# Patient Record
Sex: Male | Born: 1980 | Race: White | Marital: Single
Health system: Southern US, Academic
[De-identification: ages and names within clinical notes are randomized; demographics above are authoritative.]

---

## 2004-11-08 ENCOUNTER — Ambulatory Visit (INDEPENDENT_AMBULATORY_CARE_PROVIDER_SITE_OTHER): Payer: Self-pay

## 2005-01-10 ENCOUNTER — Ambulatory Visit (INDEPENDENT_AMBULATORY_CARE_PROVIDER_SITE_OTHER): Payer: Self-pay

## 2007-01-06 ENCOUNTER — Ambulatory Visit (INDEPENDENT_AMBULATORY_CARE_PROVIDER_SITE_OTHER): Payer: BC Managed Care – PPO

## 2007-02-03 ENCOUNTER — Encounter (INDEPENDENT_AMBULATORY_CARE_PROVIDER_SITE_OTHER): Payer: BC Managed Care – PPO

## 2007-02-27 ENCOUNTER — Encounter (INDEPENDENT_AMBULATORY_CARE_PROVIDER_SITE_OTHER): Payer: BC Managed Care – PPO | Admitting: Clinical

## 2007-03-13 ENCOUNTER — Encounter (INDEPENDENT_AMBULATORY_CARE_PROVIDER_SITE_OTHER): Payer: BC Managed Care – PPO | Admitting: Clinical

## 2007-03-17 ENCOUNTER — Encounter (HOSPITAL_COMMUNITY): Payer: BC Managed Care – PPO

## 2007-03-20 ENCOUNTER — Encounter (INDEPENDENT_AMBULATORY_CARE_PROVIDER_SITE_OTHER): Payer: BC Managed Care – PPO | Admitting: Clinical

## 2007-03-26 ENCOUNTER — Encounter (HOSPITAL_COMMUNITY): Payer: BC Managed Care – PPO | Admitting: Clinical

## 2007-04-07 ENCOUNTER — Encounter (INDEPENDENT_AMBULATORY_CARE_PROVIDER_SITE_OTHER): Payer: BC Managed Care – PPO | Admitting: Clinical

## 2007-04-21 ENCOUNTER — Encounter (HOSPITAL_COMMUNITY): Payer: BC Managed Care – PPO | Admitting: Clinical

## 2007-05-12 ENCOUNTER — Encounter (HOSPITAL_COMMUNITY): Payer: BC Managed Care – PPO

## 2007-08-02 ENCOUNTER — Encounter (HOSPITAL_PSYCHIATRIC): Payer: BC Managed Care – PPO | Admitting: Psychiatry

## 2007-08-02 ENCOUNTER — Emergency Department (HOSPITAL_COMMUNITY)
Admission: EM | Admit: 2007-08-02 | Discharge: 2007-08-02 | Disposition: A | Discharge status: Home / Self Care | Payer: BC Managed Care – PPO | Attending: Emergency Medicine | Admitting: Emergency Medicine

## 2007-08-02 ENCOUNTER — Inpatient Hospital Stay
Admission: RE | Admit: 2007-08-02 | Discharge: 2007-08-08 | DRG: 523 | Disposition: A | Discharge status: Home / Self Care | Payer: BC Managed Care – PPO | Source: Emergency Department | Attending: Psychiatry | Admitting: Psychiatry

## 2007-08-02 DIAGNOSIS — F17200 Nicotine dependence, unspecified, uncomplicated: Secondary | ICD-10-CM | POA: Diagnosis present

## 2007-08-02 DIAGNOSIS — F1020 Alcohol dependence, uncomplicated: Principal | ICD-10-CM | POA: Diagnosis present

## 2007-08-02 DIAGNOSIS — F1994 Other psychoactive substance use, unspecified with psychoactive substance-induced mood disorder: Secondary | ICD-10-CM | POA: Diagnosis present

## 2007-08-02 DIAGNOSIS — F3289 Other specified depressive episodes: Secondary | ICD-10-CM | POA: Diagnosis present

## 2007-08-02 LAB — DRUG SCREEN, HIGH OPIATE CUTOFF, WITH CONFIRMATION, URINE
AMPHETAMINE, URINE: NEGATIVE
BARBITURATE, URINE: NEGATIVE
BENZODIAZEPINE,URINE: NEGATIVE
COCAINE METAB. URINE: NEGATIVE
PHENCYCLIDINE, URINE: NEGATIVE

## 2007-08-02 LAB — CBC/DIFF
BASOPHILS: 1 % (ref 0–1)
BASOS ABS: 0.079 THOU/uL (ref 0.0–0.2)
EOS ABS: 0.495 THOU/uL — ABNORMAL HIGH (ref 0.1–0.3)
EOSINOPHIL: 5 % (ref 1–6)
HCT: 43.8 % (ref 39.8–50.2)
HGB: 15.2 g/dL (ref 13.1–17.3)
LYMPHOCYTES: 22 % (ref 20–45)
LYMPHS ABS: 2.21 THOU/uL (ref 1.0–4.8)
MCH: 32.9 pg (ref 27.4–33.0)
MCHC: 34.8 g/dL (ref 31.6–35.5)
MCV: 94.5 fL (ref 78–100)
MONOCYTES: 4 % (ref 4–13)
MONOS ABS: 0.357 THOU/uL (ref 0.1–0.9)
MPV: 6.1 FL — ABNORMAL LOW (ref 7.4–10.4)
PLATELET COUNT: 204 THO/UL (ref 140–450)
PMN ABS: 6.79 THOU/uL (ref 1.3–7.7)
PMN'S: 68 % (ref 40–75)
RBC: 4.63 MIL/uL (ref 4.46–5.70)
RDW: 12 % (ref 11.5–14.5)
WBC: 9.9 THOU/UL (ref 3.5–11.0)

## 2007-08-02 LAB — AMYLASE: AMYLASE: 70 U/L (ref <128)

## 2007-08-02 LAB — ELECTROLYTES
ANION GAP: 23 mmol/L — ABNORMAL HIGH (ref 5–16)
CARBON DIOXIDE: 20 mmol/L — ABNORMAL LOW (ref 23–33)
POTASSIUM: 3.4 mmol/L — ABNORMAL LOW (ref 3.5–5.1)
SODIUM: 143 mmol/L (ref 136–145)

## 2007-08-02 LAB — CREATININE
CREATININE: 0.79 mg/dL (ref 0.62–1.27)
ESTIMATED GLOMERULAR FILTRATION RATE: >59 mL/min/{1.73_m2} (ref >59)

## 2007-08-02 LAB — PT/INR
INR: 1 (ref 0.8–1.2)
PROTHROMBIN TIME: 10.5 s (ref 9.1–11.2)

## 2007-08-02 LAB — BILIRUBIN, TOTAL/CONJ
BILIRUBIN, TOTAL: 2.1 mg/dL — ABNORMAL HIGH (ref 0.3–1.3)
BILIRUBIN,CONJUGATED: 0.5 mg/dl — ABNORMAL HIGH (ref 0.0–0.3)

## 2007-08-02 LAB — ALT (SGPT): ALT (SGPT): 63 U/L — ABNORMAL HIGH (ref 10–46)

## 2007-08-02 LAB — LIPASE: LIPASE: 17 U/L (ref 6–51)

## 2007-08-02 LAB — GAMMA GT: GAMMA GT: 73 U/L — ABNORMAL HIGH (ref 7–50)

## 2007-08-02 LAB — BUN
BUN/CREAT RATIO: 19 (ref 6–22)
BUN: 15 mg/dL (ref 8–26)

## 2007-08-02 LAB — GLUCOSE, NON FASTING: GLUCOSE,NONFAST: 59 mg/dl — AB

## 2007-08-02 LAB — AST (SGOT): AST (SGOT): 137 U/L — ABNORMAL HIGH (ref 10–40)

## 2007-08-02 LAB — ETHANOL, SERUM: ETHANOL, SERUM: 182 mg/dL — AB

## 2007-08-02 LAB — ALK PHOS (ALKALINE PHOSPHATASE): ALKALINE PHOSPHATASE: 52 U/L (ref 38–126)

## 2007-08-02 NOTE — ED Provider Notes (Signed)
HPI Comments: Pt states that he has a drinking problem.  Currently drinking 1L vodka per noc, also 6-7 beers.  Drinking that much daily for 1 month.  Previously in rehab in April 2008.  After D/C, began drinking 1/2 pint vodka with 7-8 beers and then that escalated.  Pt states that he shakes when he doesn't drink.  No hx of EtOH withdrawal seizures.  Past 3 weeks has had difficulty tolerating PO.  Nausea after PO.  Can tolerate PO when he feels intoxicated.          Review of Systems   Constitutional: Negative.    HENT: Negative.    Cardiovascular: Negative for chest pain.   Respiratory: Is not experiencing shortness of breath.   Gastrointestinal: Positive for nausea. Negative for vomiting, abdominal pain, diarrhea and constipation.   Genitourinary: Negative for dysuria and frequency.   Musculoskeletal: Negative for neck pain and back pain.   Neurological: Negative.    All other systems reviewed and are negative.          Past History:    PMH: Depression    PSH: None    FamHx:    SocHx: +EtOH daily  3/4 ppd  No other drugs            Physical Exam   Nursing note and vitals reviewed.  Constitutional: He is oriented. He appears well-developed and well-nourished. He appears not diaphoretic. No distress.   HENT:   Head: Normocephalic and atraumatic.   Right Ear: External ear normal.   Left Ear: External ear normal.   Nose: Nose normal.   Mouth/Throat: Oropharynx is clear and moist.   Eyes: Conjunctivae and extraocular motions are normal. Pupils are equal, round, and reactive to light.   Neck: Normal range of motion. Neck supple.   Cardiovascular: Normal rate, regular rhythm, normal heart sounds and intact distal pulses.  Exam reveals no gallop and no friction rub.    No murmur heard.  Pulmonary/Chest: Effort normal and breath sounds normal. No respiratory distress. He has no wheezes. He has no rales.   Abdominal: Bowel sounds are normal. He exhibits no distension. Soft. No tenderness. He has no rebound and no guarding.     Musculoskeletal: Normal range of motion. He exhibits no edema and no tenderness.   Neurological: He is alert and oriented. He displays tremor. No cranial nerve deficit. He exhibits normal muscle tone. Coordination normal.   Skin: Skin is warm and dry. He is not diaphoretic.   Psychiatric: He has a normal mood and affect. His behavior is normal. Judgment and thought content normal.           ED Course:    Results:  Results for orders placed during the hospital encounter of 08/02/2007 (from the past 8 hours)    -CBC/DIFF     WBC (THOU/UL)                 9.9       Low: 3.5  High: 11.0     RBC (MIL/uL)                  4.63      Low: 4.46  High: 5.70     HGB (g/dL)                    46.9      Low: 13.1  High: 17.3     HCT (%)  43.8      Low: 39.8  High: 50.2     MCV (fL)                      94.5      Low: 78   High: 100     MCH (pg)                      32.9      Low: 27.4  High: 33.0     MCHC (g/dL)                   16.1      Low: 31.6  High: 35.5     RDW (%)                       12.0      Low: 11.5  High: 14.5     PLATELET COUNT (THO/UL)       204       Low: 140  High: 450     MPV (FL)                      6.1 (*)   Low: 7.4  High: 10.4     PMN'S (%)                     68        Low: 40   High: 75     PMN ABS (THOU/uL)             6.790     Low: 1.3  High: 7.7     LYMPHOCYTES (%)               22        Low: 20   High: 45     LYMPHS ABS (THOU/uL)          2.210     Low: 1.0  High: 4.8     MONOCYTES (%)                 4         Low: 4    High: 13     MONOS ABS (THOU/uL)           0.357     Low: 0.1  High: 0.9     EOSINOPHIL (%)                5         Low: 1    High: 6      EOS ABS (THOU/uL)             0.495 (*)  Low: 0.1  High: 0.3     BASOPHILS (%)                 1         Low: 0    High: 1      BASOS ABS (THOU/uL)           0.079     Low: 0.0  High: 0.2    -PT/INR     PROTHROMBIN TIME (Sec)        10.5      Low: 9.1  High: 11.2     INR  1.0       Low: 0.8   High: 1.2    -ALK PHOS (ALKALINE PHOSPHATASE)     ALKALINE PHOSPHATASE (U/L)    52        Low: 38   High: 126    -AMYLASE     AMYLASE (U/L)                 70                              -BUN     BUN (mg/dL)                   15        Low: 8    High: 26     BUN/CREAT RATIO               19        Low: 6    High: 22    -CREATININE     CREATININE (mg/dL)            4.01      Low: 0.62  High: 1.27     ESTIMATED GLOMERULAR FILTRATION RA* (ml/min/1.67m2)  >59                             -BILIRUBIN, TOTAL/CONJ     BILIRUBIN, TOTAL (mg/dL)      2.1 (*)   Low: 0.3  High: 1.3     BILIRUBIN,CONJUGATED (mg/dl)  0.5 (*)   Low: 0.0  High: 0.3    -ELECTROLYTES     SODIUM (mmol/L)               143       Low: 136  High: 145     POTASSIUM (mmol/L)            3.4 (*)   Low: 3.5  High: 5.1     CHLORIDE (mmol/L)             100       Low: 96   High: 111     CARBON DIOXIDE (mmol/L)       20 (*)    Low: 23   High: 33     ANION GAP (mmol/L)            23 (*)    Low: 5    High: 16    -GAMMA GT     GAMMA GT (U/L)                73 (*)    Low: 7    High: 50    -GLUCOSE, NON FASTING     GLUCOSE,NONFAST (mg/dl)       59 (*)                          -LIPASE     LIPASE (U/L)                  17        Low: 6    High: 51    -AST (SGOT)     AST (SGOT) (U/L)              137 (*)   Low: 10   High: 40    -ALT (SGPT)     ALT (  SGPT) (U/L)              63 (*)    Low: 10   High: 46    -ETHANOL, SERUM     ETHANOL, SERUM (mg/dL)        841 (*)                           ED Course:  Labs mildly elevated but within normal limits given patients abuse of EtOH.  Some withdrawal symptoms, so ativan 2mg  PO given.  Pt also hypoglycemic, so PO provided.  Psych consulted.  Pt admitted to [facility].    Evaluation Plan:  Lipase, BAL, UDS, Chem1, LFT, amylase, coags, CBC

## 2007-08-02 NOTE — ED Attending Handoff Note (Signed)
Alcoholic, psych seeing.

## 2007-08-02 NOTE — ED Attending Note (Signed)
 Note begun by: Dusty Getting 08/02/2007, 2:04 PM    I was physically present and directly supervised this patient's care.  Patient seen and examined.  Resident / Vito / NP history and exam reviewed.   Key elements in addition to and/or correction of that documentation are as follows:    HPI :    26 y.o. y.o. male presents with chief complaint of requesting assistance in abstaining from alcohol  intake.  The patient states that he has been drinking daily for quite some time.  He states that he gets shaky if he does not drink.  He states it this morning he felt nauseous and generally really crappy.  The patient states that he has had one episode of detox on an inpatient basis in the past.  Patient reports no history of withdrawal seizures, delirium tremens.  The patient states that he has no other chronic medical conditions and takes no medication on a daily basis.  Patient states that he uses no other recreational drugs.  He has no diagnosed psychiatric history    PE :   VS on presentation: Blood pressure 116/74, pulse 109, temperature 36.9 C (98.4 F), resp. rate 18, weight 63.504 kg (140 lb), SpO2 96%.  Thin, young male, who appears mildly distraught.  He is a fluent historian.  His reality testing is intact.  Agree with the remainder of the physical exam as documented by Dr. Carl.  Data/Test :    EKG : None  Images Review by me : None  Image Reports Review by me:  As above  Labs:    Results for orders placed during the hospital encounter of 08/02/2007 (from the past 8 hours)   CBC/DIFF   Component Value Range   . WBC 9.9  3.5-11.0 (THOU/UL)   . RBC 4.63  4.46-5.70 (MIL/uL)   . HGB 15.2  13.1-17.3 (g/dL)   . HCT 43.8  39.8-50.2 (%)   . MCV 94.5  78-100 (fL)   . MCH 32.9  27.4-33.0 (pg)   . MCHC 34.8  31.6-35.5 (g/dL)   . RDW 12.0  11.5-14.5 (%)   . PLATELET COUNT 204  140-450 (THO/UL)   . MPV 6.1 (*) 7.4-10.4 (FL)   . PMN'S 68  40-75 (%)   . PMN ABS 6.790  1.3-7.7 (THOU/uL)   . LYMPHOCYTES 22  20-45 (%)   .  LYMPHS ABS 2.210  1.0-4.8 (THOU/uL)   . MONOCYTES 4  4-13 (%)   . MONOS ABS 0.357  0.1-0.9 (THOU/uL)   . EOSINOPHIL 5  1-6 (%)   . EOS ABS 0.495 (*) 0.1-0.3 (THOU/uL)   . BASOPHILS 1  0-1 (%)   . BASOS ABS 0.079  0.0-0.2 (THOU/uL)   PT/INR   Component Value Range   . PROTHROMBIN TIME 10.5  9.1-11.2 (Sec)   . INR 1.0  0.8-1.2    ALK PHOS (ALKALINE PHOSPHATASE)   Component Value Range   . ALKALINE PHOSPHATASE 52  38-126 (U/L)   AMYLASE   Component Value Range   . AMYLASE 70  - (U/L)   BUN   Component Value Range   . BUN 15  8-26 (mg/dL)   . BUN/CREAT RATIO 19  6-22    CREATININE   Component Value Range   . CREATININE 0.79  0.62-1.27 (mg/dL)   . ESTIMATED GLOMERULAR FILTRATION RATE >59  - (ml/min/1.39m2)   BILIRUBIN, TOTAL/CONJ   Component Value Range   . BILIRUBIN, TOTAL 2.1 (*) 0.3-1.3 (mg/dL)   . BILIRUBIN,CONJUGATED 0.5 (*)  0.0-0.3 (mg/dl)   ELECTROLYTES   Component Value Range   . SODIUM 143  136-145 (mmol/L)   . POTASSIUM 3.4 (*) 3.5-5.1 (mmol/L)   . CHLORIDE 100  96-111 (mmol/L)   . CARBON DIOXIDE 20 (*) 23-33 (mmol/L)   . ANION GAP 23 (*) 5-16 (mmol/L)   GAMMA GT   Component Value Range   . GAMMA GT 73 (*) 7-50 (U/L)   GLUCOSE, NON FASTING   Component Value Range   . GLUCOSE,NONFAST 59 (*) - (mg/dl)   LIPASE   Component Value Range   . LIPASE 17  6-51 (U/L)   AST (SGOT)   Component Value Range   . AST (SGOT) 137 (*) 10-40 (U/L)   ALT (SGPT)   Component Value Range   . ALT (SGPT) 63 (*) 10-46 (U/L)   ETHANOL, SERUM   Component Value Range   . ETHANOL, SERUM 182 (*) - (mg/dL)           Review of Prior Data :       Prior Images : None  Prior EKG : None  Online Medical Records:  None  Transfer Docs/Images:  None    Initial Assessment:  Given the above, will plan on performing a relatively complete laboratory screening evaluation for this patient who is at mild risk of systemic competitions from his heavy chronic EtOH use.  In addition will empirically treat the patient with IV fluids for presumed mild dehydration,  and administer prophylactic Ativan at low doses for alcohol  withdrawal pending the obtainment of a urine drug sample.    MDM/Plan:    Given the above, anticipate evaluation and admission by the psychiatry service pending review of the patient's screening laboratory values.    ED Course:   Lab results were obtained and reviewed.  The patient remained in stable condition.  Psychiatry was consulted to admit the patient.  They agreed with the indication for inpatient treatment.  Patient care was transferred to Dr. Minardi a change of shift to transfer to the inpatient psychiatric facility pending.      Clinical Impression:       Disposition: Admitted    Follow up:       Clinical Impression:   Encounter Diagnoses   Name Primary?   . Alcohol  Abuse    . Depression        Future Appointments scheduled in [place]:   No future appointments.       CRITICAL CARE : None

## 2007-08-03 LAB — ELECTROLYTES: POTASSIUM: 3.9 mmol/L (ref 3.5–5.1)

## 2007-08-08 ENCOUNTER — Other Ambulatory Visit: Payer: Self-pay

## 2007-08-08 LAB — AST (SGOT): AST (SGOT): 74 U/L — ABNORMAL HIGH (ref 10–40)

## 2007-08-08 LAB — ALT (SGPT): ALT (SGPT): 114 U/L — ABNORMAL HIGH (ref 10–46)

## 2007-08-11 ENCOUNTER — Ambulatory Visit (INDEPENDENT_AMBULATORY_CARE_PROVIDER_SITE_OTHER): Payer: BC Managed Care – PPO | Admitting: Clinical

## 2007-08-12 ENCOUNTER — Inpatient Hospital Stay
Admission: RE | Admit: 2007-08-12 | Discharge: 2007-08-18 | DRG: 523 | Disposition: A | Discharge status: Home / Self Care | Payer: BC Managed Care – PPO | Source: Ambulatory Visit | Attending: Psychiatry | Admitting: Psychiatry

## 2007-08-12 ENCOUNTER — Ambulatory Visit (INDEPENDENT_AMBULATORY_CARE_PROVIDER_SITE_OTHER): Payer: BC Managed Care – PPO | Admitting: Clinical Social Worker

## 2007-08-12 ENCOUNTER — Encounter (HOSPITAL_PSYCHIATRIC): Payer: BC Managed Care – PPO | Admitting: Psychiatry

## 2007-08-12 DIAGNOSIS — F17200 Nicotine dependence, unspecified, uncomplicated: Secondary | ICD-10-CM | POA: Diagnosis present

## 2007-08-12 DIAGNOSIS — F329 Major depressive disorder, single episode, unspecified: Secondary | ICD-10-CM | POA: Diagnosis present

## 2007-08-12 DIAGNOSIS — R000 Tachycardia, unspecified: Secondary | ICD-10-CM | POA: Diagnosis present

## 2007-08-12 DIAGNOSIS — F1020 Alcohol dependence, uncomplicated: Secondary | ICD-10-CM | POA: Diagnosis present

## 2007-08-12 DIAGNOSIS — F1994 Other psychoactive substance use, unspecified with psychoactive substance-induced mood disorder: Principal | ICD-10-CM | POA: Diagnosis present

## 2007-08-12 DIAGNOSIS — R945 Abnormal results of liver function studies: Secondary | ICD-10-CM | POA: Diagnosis present

## 2007-08-12 LAB — CBC/DIFF
BASOPHILS: 1 % (ref 0–1)
BASOS ABS: 0.074 10*3/uL (ref 0.0–0.2)
EOS ABS: 0.28 THOU/uL (ref 0.1–0.3)
EOSINOPHIL: 5 % (ref 1–6)
HCT: 42.3 % (ref 39.8–50.2)
HGB: 14.9 g/dL (ref 13.1–17.3)
LYMPHOCYTES: 29 % (ref 20–45)
LYMPHS ABS: 1.64 THOU/uL (ref 1.0–4.8)
MCH: 32.8 pg (ref 27.4–33.0)
MCHC: 35.1 g/dL (ref 31.6–35.5)
MCV: 93.4 fL (ref 78–100)
MONOCYTES: 7 % (ref 4–13)
MONOS ABS: 0.426 10*3/uL (ref 0.1–0.9)
MPV: 5.6 FL — ABNORMAL LOW (ref 7.4–10.4)
PLATELET COUNT: 391 THO/UL (ref 140–450)
PMN ABS: 3.33 10*3/uL (ref 1.3–7.7)
PMN'S: 58 % (ref 40–75)
RBC: 4.53 MIL/uL (ref 4.46–5.70)
RDW: 11.9 % (ref 11.5–14.5)
WBC: 5.8 10*3/uL (ref 3.5–11.0)

## 2007-08-12 LAB — BILIRUBIN TOTAL: BILIRUBIN, TOTAL: 0.9 mg/dL (ref 0.3–1.3)

## 2007-08-12 LAB — CREATININE
CREATININE: 0.79 mg/dL (ref 0.62–1.27)
ESTIMATED GLOMERULAR FILTRATION RATE: >59 mL/min/{1.73_m2} (ref >59)

## 2007-08-12 LAB — ALT (SGPT): ALT (SGPT): 72 U/L — ABNORMAL HIGH (ref 10–46)

## 2007-08-12 LAB — AST (SGOT): AST (SGOT): 66 U/L — ABNORMAL HIGH (ref 10–40)

## 2007-08-12 LAB — ALBUMIN: ALBUMIN: 4.7 g/dL (ref 3.5–4.8)

## 2007-08-12 LAB — TOTAL PROTEIN: TOTAL PROTEIN: 7.1 g/dL (ref 6.4–8.3)

## 2007-08-12 LAB — BUN: BUN: 10 mg/dL (ref 8–26)

## 2007-08-12 LAB — GAMMA GT: GAMMA GT: 92 U/L — ABNORMAL HIGH (ref 7–50)

## 2007-08-12 LAB — ETHANOL, SERUM: ETHANOL, SERUM: 269 mg/dL

## 2007-08-18 ENCOUNTER — Ambulatory Visit (INDEPENDENT_AMBULATORY_CARE_PROVIDER_SITE_OTHER): Payer: BC Managed Care – PPO | Admitting: Clinical

## 2007-08-18 ENCOUNTER — Other Ambulatory Visit: Payer: Self-pay

## 2007-08-19 ENCOUNTER — Ambulatory Visit (INDEPENDENT_AMBULATORY_CARE_PROVIDER_SITE_OTHER): Payer: BC Managed Care – PPO | Admitting: Clinical Social Worker

## 2007-08-24 ENCOUNTER — Emergency Department (HOSPITAL_COMMUNITY): Payer: Self-pay | Admitting: EMERGENCY MEDICINE

## 2007-08-25 ENCOUNTER — Ambulatory Visit (INDEPENDENT_AMBULATORY_CARE_PROVIDER_SITE_OTHER): Payer: BC Managed Care – PPO | Admitting: Clinical

## 2007-08-26 ENCOUNTER — Ambulatory Visit (HOSPITAL_COMMUNITY): Payer: BC Managed Care – PPO | Admitting: Clinical Social Worker

## 2007-09-01 ENCOUNTER — Ambulatory Visit (HOSPITAL_COMMUNITY): Payer: BC Managed Care – PPO | Admitting: Clinical

## 2007-09-02 ENCOUNTER — Ambulatory Visit (HOSPITAL_COMMUNITY): Payer: BC Managed Care – PPO | Admitting: Clinical Social Worker

## 2007-09-04 ENCOUNTER — Ambulatory Visit (HOSPITAL_COMMUNITY): Payer: BC Managed Care – PPO | Admitting: Clinical

## 2007-10-16 ENCOUNTER — Encounter (HOSPITAL_COMMUNITY): Payer: BC Managed Care – PPO

## 2008-05-26 ENCOUNTER — Emergency Department (HOSPITAL_COMMUNITY): Admission: EM | Admit: 2008-05-26 | Discharge: 2008-05-27 | Payer: Self-pay | Admitting: Family Medicine

## 2008-08-21 ENCOUNTER — Observation Stay (HOSPITAL_COMMUNITY): Admission: EM | Admit: 2008-08-21 | Discharge: 2008-08-22 | Payer: Self-pay | Admitting: Emergency Medicine

## 2008-08-22 ENCOUNTER — Encounter (INDEPENDENT_AMBULATORY_CARE_PROVIDER_SITE_OTHER): Payer: Self-pay

## 2009-04-29 IMAGING — CT CT PELVIS W/ CM
3 of 5 series · 14 of 32 positions shown, 19 images · IV contrast (agent unspecified)
Comparison: None

CT ABDOMEN

CLINICAL DATA: Right lower quadrant pain.

CT ABDOMEN AND PELVIS WITH CONTRAST
TECHNIQUE: Multidetector CT imaging of the abdomen and pelvis was
performed using the standard protocol following bolus
administration of intravenous contrast.
Contrast: 1 ml Hmnipaque-IOO

[Series 2: abd pelvis · axial · 0.75mm/px · z∈[-453,-173]mm · 5 of 84 slices shown, 10 images]
[im 14/84  soft-tissue]
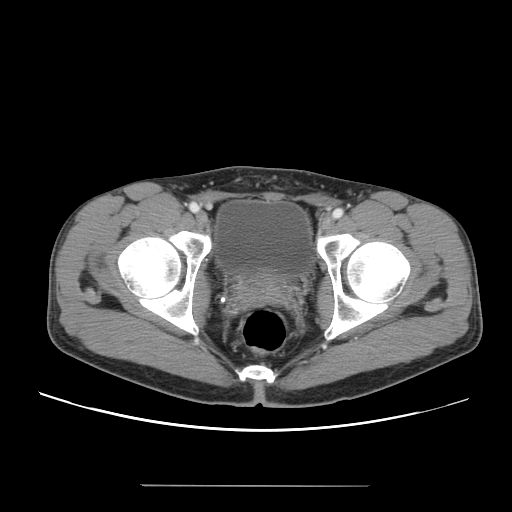
[im 14/84  bone]
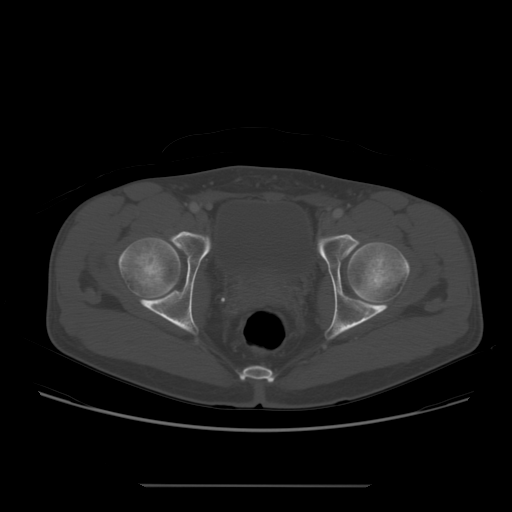
[im 28/84  soft-tissue]
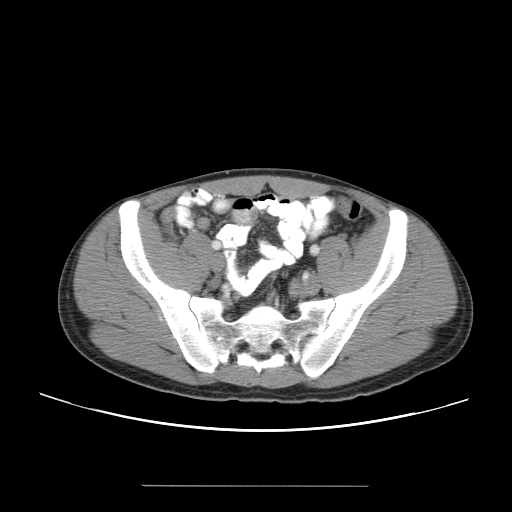
[im 28/84  lung]
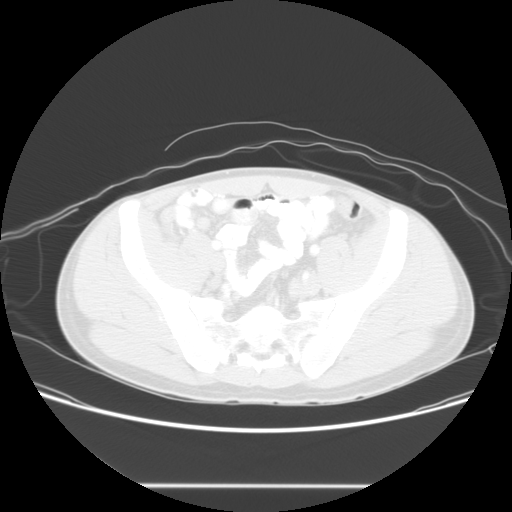
[im 42/84  soft-tissue]
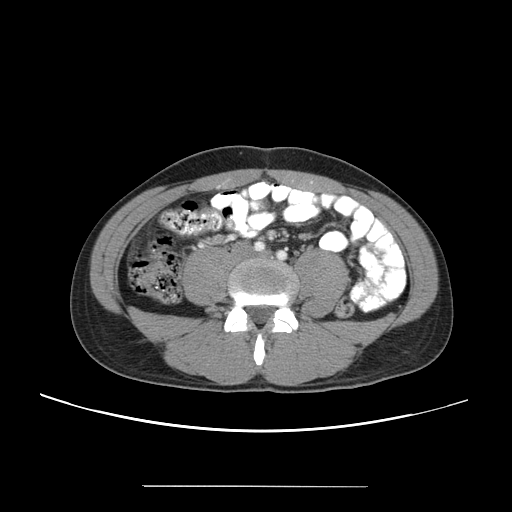
[im 42/84  lung]
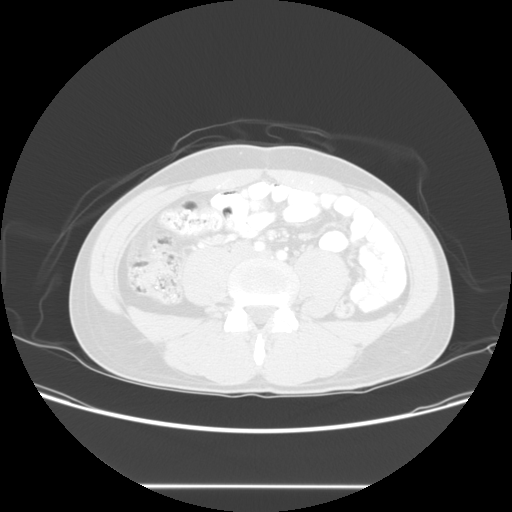
[im 56/84  soft-tissue]
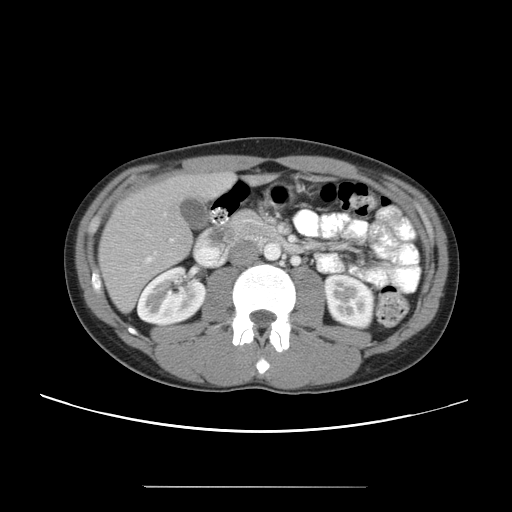
[im 56/84  lung]
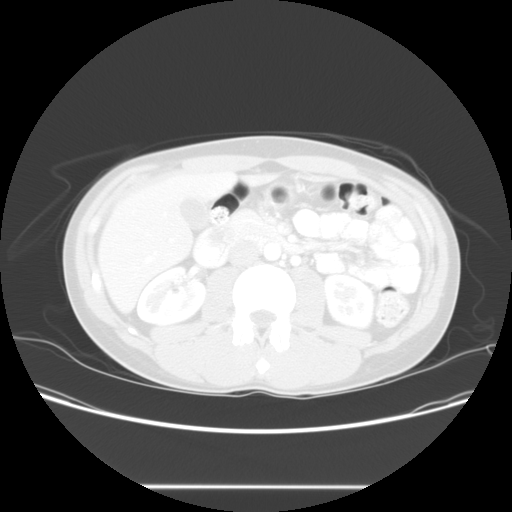
[im 70/84  soft-tissue]
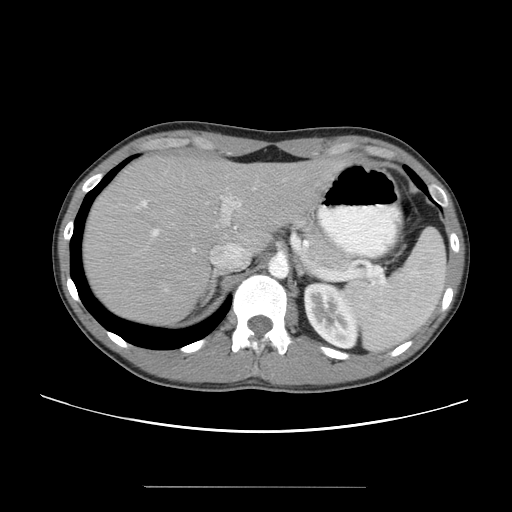
[im 70/84  lung]
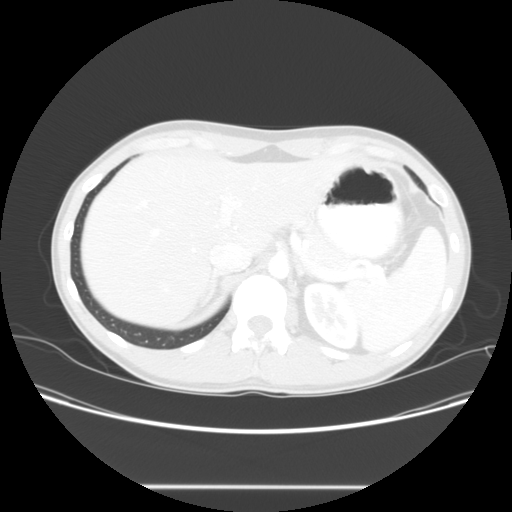

[Series 3: recon 2: abd pelvis · axial · 0.75mm/px · z∈[-283,-163]mm · 3 of 48 slices shown]
[im 12/48  soft-tissue]
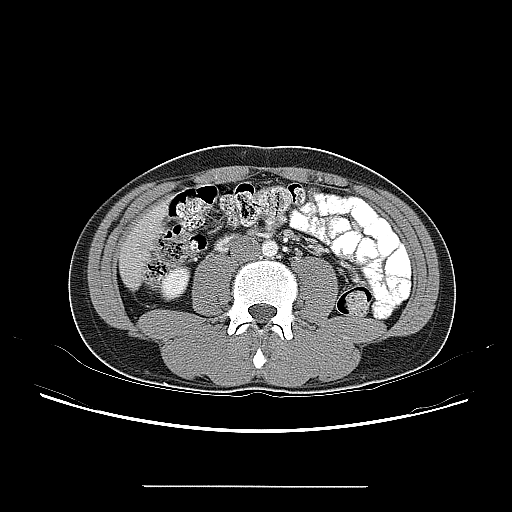
[im 24/48  soft-tissue]
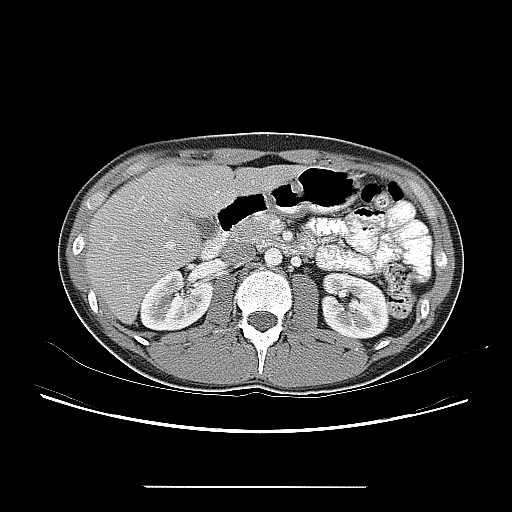
[im 36/48  soft-tissue]
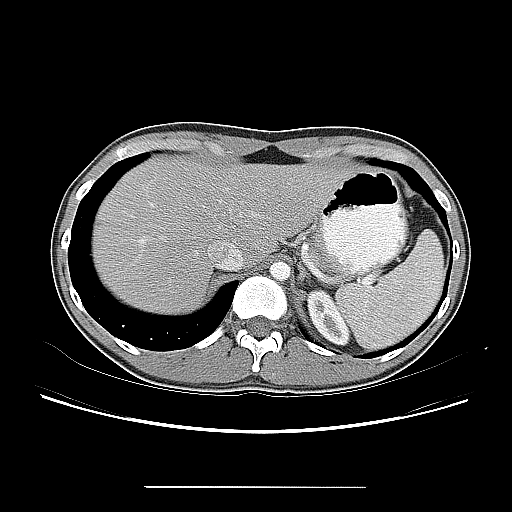

[Series 400: reformatted · sagittal · 0.80mm/px · 6 of 100 slices shown]
[im 12/100  soft-tissue]
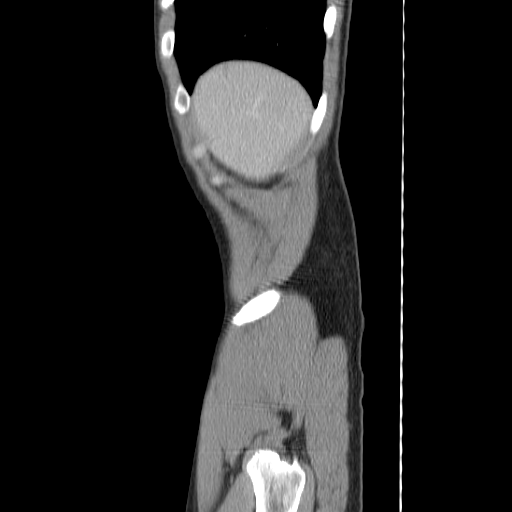
[im 23/100  soft-tissue]
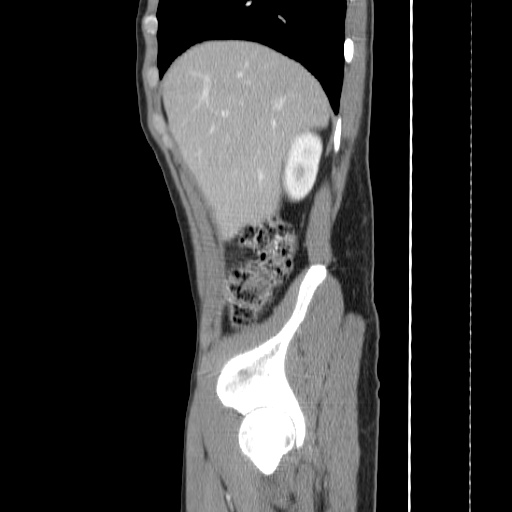
[im 34/100  soft-tissue]
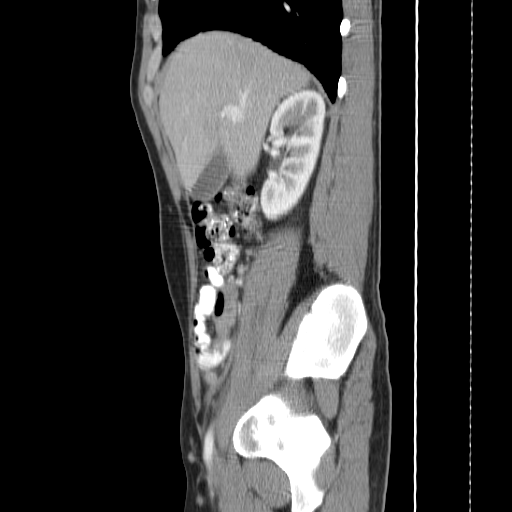
[im 45/100  soft-tissue]
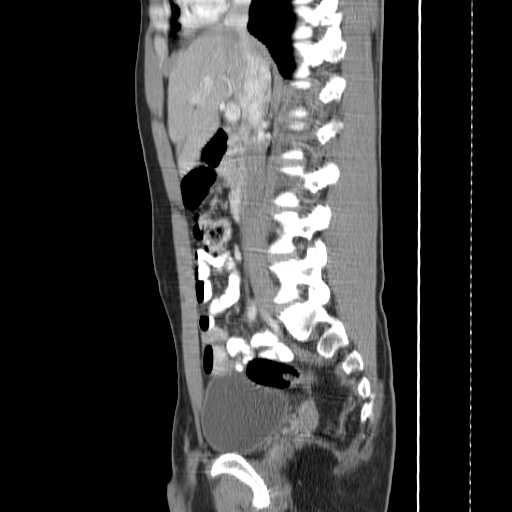
[im 56/100  soft-tissue]
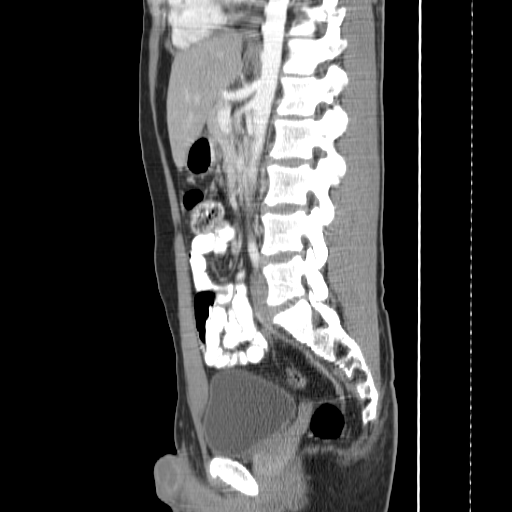
[im 67/100  soft-tissue]
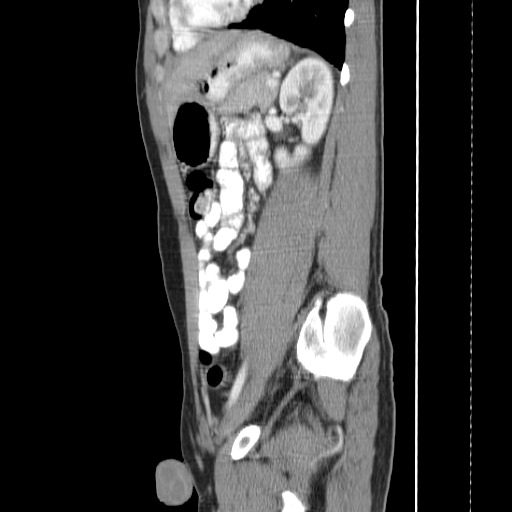

[14 of 32 positions shown; findings below may reference images not displayed]

FINDINGS: Lung bases are clear.  No pleural or pericardial fluid.
The liver, gallbladder, spleen, pancreas, adrenal glands and
kidneys are all normal.  The aorta and IVC are normal.  No
retroperitoneal mass or adenopathy.  No free intraperitoneal fluid
or air.  No primary bowel pathology seen.
IMPRESSION: Normal CT scan of the abdomen

CT PELVIS
FINDINGS: The appendix is thick-walled and dilated consistent with
appendicitis.  No evidence of rupture or abscess.  No free fluid in
the pelvis.  Bladder, prostate gland and seminal vesicles are
unremarkable.
IMPRESSION: Early appendicitis without rupture or abscess.

## 2011-01-09 NOTE — H&P (Signed)
NAME:  [name], [name] NO.:  [id number]   MEDICAL RECORD NO.:  [id number]          PATIENT TYPE:  INP   LOCATION:  5032                         FACILITY:  [facility]   PHYSICIAN:  Maisie Fus A. Cornett, M.D.DATE OF BIRTH:  February 26, 1981   DATE OF ADMISSION:  08/21/2008  DATE OF DISCHARGE:                              HISTORY & PHYSICAL   CHIEF COMPLAINT:  Right lower quadrant pain.   HISTORY OF PRESENT ILLNESS:  The patient is a 30 year old white male  with 2-day history of right lower quadrant pain.  The pain started on  Thursday night and has progressed over the last 48 hours.  He states his  pain is in the right lower quadrant and actually, is a little bit better  today, but he came in the emergency room to get checked out.  Denies  any nausea or vomiting.  The pain is anywhere from 7-8/10, located in  the right lower quadrant without radiation.  CT scan was obtained, which  showed acute appendicitis.   PAST MEDICAL HISTORY:  Alcohol abuse, now in a home for recovering  alcoholics.   PAST SURGICAL HISTORY:  None.   MEDICATIONS:  None.   ALLERGIES:  None.   REVIEW OF SYSTEMS:  As stated above.  Otherwise, negative x15 points.   FAMILY HISTORY:  Noncontributory.   PHYSICAL EXAMINATION:  VITAL SIGNS:  Temperature 97, pulse is 59, and  blood pressure 109/73.  GENERAL:  White male in no apparent distress.  HEENT:  No evidence of scleral icterus.  Oropharynx is moist.  NECK:  Supple and nontender.  Full range of motion.  PULMONARY:  Lung sounds are clear bilaterally.  CHEST:  Wall motion normal.  CARDIOVASCULAR:  Regular rate and rhythm without rub, murmur, or gallop.  ABDOMEN:  Tender over right lower quadrant with rebound and guarding  noted.  No mass.  EXTREMITIES:  No edema.   LABORATORY DATA:  Abdomen and pelvic CT scan shows a dilated appendix,  which is inflamed, consistent with acute appendicitis.  However, the  patient's white count is 7200, hemoglobin of  15.4, and platelet count of  215,000.  Sodium 139, potassium 3.9, chloride 103, CO2 of 29, BUN 12,  creatinine is 0.77, and glucose 102.   IMPRESSION:  Acute appendicitis.   PLAN:  Recommend laparoscopic appendectomy for acute appendicitis.  Risk  of bleeding, infection, intraabdominal abscess were discussed with the  patient.  He understands and aggress to proceed.  Also, the risk of  having to convert to an open  procedure was discussed as well.      Thomas A. Cornett, M.D.  Electronically Signed     TAC/MEDQ  D:  08/21/2008  T:  08/22/2008  Job:  [id number]

## 2011-01-09 NOTE — Op Note (Signed)
NAME:  [name], [name] NO.:  [id number]   MEDICAL RECORD NO.:  [id number]          PATIENT TYPE:  INP   LOCATION:  5032                         FACILITY:  [facility]   PHYSICIAN:  Maisie Fus A. Cornett, M.D.DATE OF BIRTH:  Apr 30, 1981   DATE OF PROCEDURE:  08/22/2008  DATE OF DISCHARGE:                               OPERATIVE REPORT   PREOPERATIVE DIAGNOSIS:  Acute appendicitis.   POSTOPERATIVE DIAGNOSIS:  Acute appendicitis.   PROCEDURE:  Laparoscopic appendectomy.   SURGEON:  Maisie Fus A. Cornett, MD   ASSISTANT:  OR staff.   ANESTHESIA:  General endotracheal anesthesia with 0.25% Sensorcaine  local.   ESTIMATED BLOOD LOSS:  10 mL.   DRAINS:  None.   INDICATIONS FOR PROCEDURE:  The patient is a 30 year old male with acute  appendicitis by physical examination and CT scan.  He presents to the  operating room for laparoscopic appendectomy.   DESCRIPTION OF PROCEDURE:  The patient was brought to the operating room  and placed supine.  After induction of general anesthesia, a Foley  catheter was placed and the abdomen was prepped and draped in sterile  fashion.  He received preoperative antibiotics.  A 1-cm supraumbilical  incision was made.  Dissection was carried down his fascia.  His fascia  was opened in midline.  The abdominal cavity was entered under direct  vision.  A 12-mm Hasson cannula was placed under direct vision.  Pneumoperitoneum was created to 15 mmHg of CO2 and laparoscope was  placed.  Two 5-mm ports were then placed, one in the midline just below  the umbilicus and a second in the left lower quadrant, both under direct  vision.  The appendix was easily identified, and it was acutely inflamed  without perforation.  The tip was grasped with a grasper.  A Harmonic  scalpel was used take the mesoappendix down to the base of cecum.  Using  a 5-mm scope and GIA 45 stapling device with a blue cartridge was placed  across the base of the appendix and  fired.  The base was well closed.  The appendix was then placed an EndoCatch bag and extracted.  I then  replaced my 10-mm scope.  I reexamined the stump of the appendix, it was  well closed without signs of bleeding.  There was some slight  inflammatory change to the cecum.  It was not thickened though.  The  remainder of the small bowel and colon were normal.  The gallbladder,  liver, and stomach were grossly normal.  Irrigation was used and the  area was found was to be hemostatic.  This was suctioned out.  We then  removed our ports with no evidence of port site bleeding.  These were  passed off the field.  We removed the umbilical port and passed it off  the field.  The specimen was passed off the field and placed in a  container for  pathology.  We closed the fascial opening with a pursestring suture of 0  Vicryl and 4-0 Monocryl was used to close the skin.  Dermabond was  applied as final dressing.  All final counts of sponge, needle, and  instruments were found to be correct at this portion of case.  The  patient was taken to recovery in satisfactory condition.      Thomas A. Cornett, M.D.  Electronically Signed     TAC/MEDQ  D:  08/22/2008  T:  08/22/2008  Job:  [id number]

## 2011-01-12 NOTE — Discharge Summary (Signed)
NAME:  DAVIDSON, PALMIERI NO.:  [id number]   MEDICAL RECORD NO.:  [id number]          PATIENT TYPE:  OBV   LOCATION:  5032                         FACILITY:  [facility]   PHYSICIAN:  Currie Paris, M.D.DATE OF BIRTH:  [birth year]   DATE OF ADMISSION:  08/21/2008  DATE OF DISCHARGE:  08/22/2008                               DISCHARGE SUMMARY   FINAL DIAGNOSIS:  Acute appendicitis.   CLINICAL HISTORY:  This is a 30 year old gentleman who presented to the  emergency room on August 21, 2009, with signs and symptoms of acute  appendicitis.  After discussion of the situation with the patient, he  was taken to the operating room by Dr. Luisa Hart.  He underwent  laparoscopic appendectomy with a finding of acute appendicitis.   Postoperatively, the patient did well.  On the day following surgery, he  was able to be discharged.  He is tolerating diet and the pain was well  controlled.   Pathology report confirmed acute appendicitis.      Currie Paris, M.D.  Electronically Signed     CJS/MEDQ  D:  09/15/2008  T:  09/15/2008  Job:  [id number]

## 2011-05-28 LAB — BASIC METABOLIC PANEL
Calcium: 9.4
Creatinine, Ser: 0.8
GFR calc Af Amer: >60
GFR calc non Af Amer: >60
Sodium: 135

## 2011-05-28 LAB — CBC
Hemoglobin: 16.5
RBC: 5.35

## 2011-05-28 LAB — DIFFERENTIAL
Basophils Absolute: 0
Lymphocytes Relative: 25
Monocytes Absolute: 0.7
Monocytes Relative: 7
Neutro Abs: 6.8
Neutrophils Relative %: 66

## 2011-05-28 LAB — RAPID URINE DRUG SCREEN, HOSP PERFORMED: Tetrahydrocannabinol: NOT DETECTED

## 2011-05-28 LAB — TRICYCLICS SCREEN, URINE: TCA Scrn: NOT DETECTED

## 2011-05-28 LAB — HEPATIC FUNCTION PANEL
ALT: 13
AST: 19
Albumin: 4.5
Bilirubin, Direct: 0.2
Total Bilirubin: 1.5 — ABNORMAL HIGH

## 2011-05-28 LAB — ETHANOL: Alcohol, Ethyl (B): 6

## 2011-06-01 LAB — URINALYSIS, ROUTINE W REFLEX MICROSCOPIC
Bilirubin Urine: NEGATIVE
Glucose, UA: NEGATIVE mg/dL
Hgb urine dipstick: NEGATIVE
Ketones, ur: NEGATIVE mg/dL
Protein, ur: NEGATIVE mg/dL
Urobilinogen, UA: 1 mg/dL (ref 0.0–1.0)

## 2011-06-01 LAB — DIFFERENTIAL
Basophils Relative: 1 % (ref 0–1)
Eosinophils Absolute: 0.2 10*3/uL (ref 0.0–0.7)
Eosinophils Relative: 3 % (ref 0–5)
Lymphs Abs: 1.6 10*3/uL (ref 0.7–4.0)
Monocytes Relative: 7 % (ref 3–12)
Neutrophils Relative %: 68 % (ref 43–77)

## 2011-06-01 LAB — CBC
HCT: 45.5 % (ref 39.0–52.0)
MCHC: 33.8 g/dL (ref 30.0–36.0)
MCV: 89.8 fL (ref 78.0–100.0)
RBC: 5.07 MIL/uL (ref 4.22–5.81)

## 2011-06-01 LAB — COMPREHENSIVE METABOLIC PANEL
AST: 16 U/L (ref 0–37)
BUN: 12 mg/dL (ref 6–23)
CO2: 29 mEq/L (ref 19–32)
Calcium: 9.5 mg/dL (ref 8.4–10.5)
Chloride: 103 mEq/L (ref 96–112)
Creatinine, Ser: 0.77 mg/dL (ref 0.4–1.5)
GFR calc Af Amer: >60 mL/min (ref >60)
GFR calc non Af Amer: >60 mL/min (ref >60)
Glucose, Bld: 102 mg/dL — ABNORMAL HIGH (ref 70–99)
Total Bilirubin: 0.9 mg/dL (ref 0.3–1.2)

## 2011-06-01 LAB — POCT I-STAT, CHEM 8
BUN: 14 mg/dL (ref 6–23)
Calcium, Ion: 1.24 mmol/L (ref 1.12–1.32)
Chloride: 102 mEq/L (ref 96–112)
Creatinine, Ser: 1 mg/dL (ref 0.4–1.5)
Glucose, Bld: 93 mg/dL (ref 70–99)
HCT: 46 % (ref 39.0–52.0)
Hemoglobin: 15.6 g/dL (ref 13.0–17.0)
Potassium: 3.9 meq/L (ref 3.5–5.1)
Sodium: 141 mEq/L (ref 135–145)
TCO2: 29 mmol/L (ref 0–100)

## 2013-11-12 ENCOUNTER — Other Ambulatory Visit (HOSPITAL_COMMUNITY)
Admission: RE | Admit: 2013-11-12 | Discharge: 2013-11-12 | Disposition: A | Discharge status: Home / Self Care | Payer: BC Managed Care – PPO | Source: Ambulatory Visit | Attending: Emergency Medicine | Admitting: Emergency Medicine

## 2013-11-12 ENCOUNTER — Encounter (HOSPITAL_COMMUNITY): Payer: Self-pay | Admitting: Emergency Medicine

## 2013-11-12 ENCOUNTER — Emergency Department (HOSPITAL_COMMUNITY)
Admission: EM | Admit: 2013-11-12 | Discharge: 2013-11-12 | Disposition: A | Discharge status: Home / Self Care | Payer: BC Managed Care – PPO | Source: Home / Self Care

## 2013-11-12 DIAGNOSIS — Z113 Encounter for screening for infections with a predominantly sexual mode of transmission: Secondary | ICD-10-CM | POA: Insufficient documentation

## 2013-11-12 DIAGNOSIS — N342 Other urethritis: Secondary | ICD-10-CM

## 2013-11-12 LAB — POCT URINALYSIS DIP (DEVICE)
BILIRUBIN URINE: NEGATIVE
Glucose, UA: NEGATIVE mg/dL
Hgb urine dipstick: NEGATIVE
KETONES UR: NEGATIVE mg/dL
LEUKOCYTES UA: NEGATIVE
Nitrite: NEGATIVE
PH: 7 (ref 5.0–8.0)
Protein, ur: NEGATIVE mg/dL
Specific Gravity, Urine: 1.02 (ref 1.005–1.030)
Urobilinogen, UA: 0.2 mg/dL (ref 0.0–1.0)

## 2013-11-12 LAB — HIV ANTIBODY (ROUTINE TESTING W REFLEX): HIV: NONREACTIVE

## 2013-11-12 MED ORDER — AZITHROMYCIN 250 MG PO TABS
1000.0000 mg | ORAL_TABLET | Freq: Every day | ORAL | Status: DC
Start: 2013-11-12 — End: 2013-11-12
  Administered 2013-11-12: 1000 mg via ORAL

## 2013-11-12 MED ORDER — CEFTRIAXONE SODIUM 250 MG IJ SOLR
INTRAMUSCULAR | Status: AC
  Filled 2013-11-12: qty 250

## 2013-11-12 MED ORDER — AZITHROMYCIN 250 MG PO TABS
ORAL_TABLET | ORAL | Status: AC
  Filled 2013-11-12: qty 4

## 2013-11-12 MED ORDER — CEFTRIAXONE SODIUM 250 MG IJ SOLR
250.0000 mg | Freq: Once | INTRAMUSCULAR | Status: AC
  Administered 2013-11-12: 250 mg via INTRAMUSCULAR

## 2013-11-12 NOTE — ED Notes (Signed)
Pt  Reports  Sensation  Of   Tingling in  Urethra            For  sev  Weeks       Reports  Unprotected  Sex  Recently  Prior  To  The  Symptoms

## 2013-11-12 NOTE — ED Provider Notes (Signed)
Medical screening examination/treatment/procedure(s) were performed by non-physician practitioner and as supervising physician I was immediately available for consultation/collaboration.  Leslee Homeavid Klever Twyford, M.D.  Reuben Likesavid C Caroll Weinheimer, MD 11/12/13 [phone]

## 2013-11-12 NOTE — ED Provider Notes (Signed)
CSN: [id number]     Arrival date & time 11/12/13  1225 History   First MD Initiated Contact with Patient 11/12/13 1318     Chief Complaint  Patient presents with  . SEXUALLY TRANSMITTED DISEASE   (Consider location/radiation/quality/duration/timing/severity/associated sxs/prior Treatment) HPI Comments: 33 year old male complaining of tingling sensation at the terminal end of the urethra. It is present most of the time but is worse with urination. He states prolonged sitting also makes it worse. Also has frequency of urination. Denies penile discharge or pain. Denies external lesions or rash. States he had anal sex with a male several days ago and within 24 hours developed this symptom.   History reviewed. No pertinent past medical history. No past surgical history on file. History reviewed. No pertinent family history. History  Substance Use Topics  . Smoking status: Not on file  . Smokeless tobacco: Not on file  . Alcohol Use: Not on file    Review of Systems  Constitutional: Negative.   Gastrointestinal: Negative.   Genitourinary: Positive for dysuria and frequency. Negative for urgency, hematuria, flank pain, discharge, penile swelling, difficulty urinating, genital sores, penile pain and testicular pain.  All other systems reviewed and are negative.    Allergies  Review of patient's allergies indicates no known allergies.  Home Medications   Current Outpatient Rx  Name  Route  Sig  Dispense  Refill  . BuPROPion HCl (WELLBUTRIN SR PO)   Oral   Take by mouth.         . Escitalopram Oxalate (LEXAPRO PO)   Oral   Take by mouth.         . Loratadine (CLARITIN PO)   Oral   Take by mouth.          BP 119/76  Pulse 65  Temp(Src) 97.9 F (36.6 C) (Oral)  Resp 16  SpO2 99% Physical Exam  Nursing note and vitals reviewed. Constitutional: He is oriented to person, place, and time. He appears well-developed and well-nourished. No distress.  Neck: Normal range  of motion. Neck supple.  Cardiovascular: Normal rate.   Pulmonary/Chest: Effort normal. No respiratory distress.  Musculoskeletal: He exhibits no edema and no tenderness.  Neurological: He is alert and oriented to person, place, and time. He exhibits normal muscle tone.  Skin: Skin is warm and dry. No rash noted.  Psychiatric: He has a normal mood and affect.    ED Course  Procedures (including critical care time) Labs Review Labs Reviewed  HIV ANTIBODY (ROUTINE TESTING)  POCT URINALYSIS DIP (DEVICE)  URINE CYTOLOGY ANCILLARY ONLY   Imaging Review No results found.   MDM   1. Urethritis      Rocephin 250 mg IM Azithromycin 1gm po Urine cytology and serum results pending.      Hayden Rasmussenavid Jeannie Mallinger, NP 11/12/13 1408  Hayden Rasmussenavid Torsten Weniger, NP 11/12/13 1731

## 2013-11-12 NOTE — Discharge Instructions (Signed)
Urethritis, Adult Urethritis is an inflammation of the tube through which urine exits your bladder (urethra).  CAUSES Urethritis is often caused by an infection in your urethra. The infection can be viral, like herpes. The infection can also be bacterial, like gonorrhea. RISK FACTORS Risk factors of urethritis include:  Having sex without using a condom.  Having multiple sexual partners.  Having poor hygiene. SIGNS AND SYMPTOMS Symptoms of urethritis are less noticeable in women than in men. These symptoms include:  Burning feeling when you urinate (dysuria).  Discharge from your urethra.  Blood in your urine (hematuria).  Urinating more than usual. DIAGNOSIS  To confirm a diagnosis of urethritis, your health care provider will do the following:  Ask about your sexual history.  Perform a physical exam.  Have you provide a sample of your urine for lab testing.  Use a cotton swab to gently collect a sample from your urethra for lab testing. TREATMENT  It is important to treat urethritis. Depending on the cause, untreated urethritis may lead to serious genital infections and possibly infertility. Urethritis caused by a bacterial infection is treated with antibiotics. All sexual partners must be treated.  HOME CARE INSTRUCTIONS  Do not have sex until the test results are known and treatment is completed, even if your symptoms go away before you finish treatment.  Finish all medicines that you are prescribed. SEEK MEDICAL CARE IF:   Your symptoms are not improved in 3 days.  Your symptoms are getting worse.  You develop abdominal pain or pelvic pain (in women).  You develop joint pain. SEEK IMMEDIATE MEDICAL CARE IF:   You have a fever with a temperature of 101.41F (38.8C) or greater.  You have severe pain in the belly, back, or side.  You have repeated vomiting. Document Released: 02/06/2001 Document Revised: 06/03/2013 Document Reviewed: 04/13/2013 [facility]  Patient Information 2014 [place], [place].  Sexually Transmitted Disease A sexually transmitted disease (STD) is a disease or infection that may be passed (transmitted) from person to person, usually during sexual activity. This may happen by way of saliva, semen, blood, vaginal mucus, or urine. Common STDs include:   Gonorrhea.   Chlamydia.   Syphilis.   HIV and AIDS.   Genital herpes.   Hepatitis B and C.   Trichomonas.   Human papillomavirus (HPV).   Pubic lice.   Scabies.  Mites.  Bacterial vaginosis. WHAT ARE CAUSES OF STDs? An STD may be caused by bacteria, a virus, or parasites. STDs are often transmitted during sexual activity if one person is infected. However, they may also be transmitted through nonsexual means. STDs may be transmitted after:   Sexual intercourse with an infected person.   Sharing sex toys with an infected person.   Sharing needles with an infected person or using unclean piercing or tattoo needles.  Having intimate contact with the genitals, mouth, or rectal areas of an infected person.   Exposure to infected fluids during birth. WHAT ARE THE SIGNS AND SYMPTOMS OF STDs? Different STDs have different symptoms. Some people may not have any symptoms. If symptoms are present, they may include:   Painful or bloody urination.   Pain in the pelvis, abdomen, vagina, anus, throat, or eyes.   Skin rash, itching, irritation, growths, sores (lesions), ulcerations, or warts in the genital or anal area.  Abnormal vaginal discharge with or without bad odor.   Penile discharge in men.   Fever.   Pain or bleeding during sexual intercourse.   Swollen glands  in the groin area.   Yellow skin and eyes (jaundice). This is seen with hepatitis.   Swollen testicles.  Infertility.  Sores and blisters in the mouth. HOW ARE STDs DIAGNOSED? To make a diagnosis, your health care provider may:   Take a medical history.   Perform  a physical exam.   Take a sample of any discharge for examination.  Swab the throat, cervix, opening to the penis, rectum, or vagina for testing.  Test a sample of your first morning urine.   Perform blood tests.   Perform a Pap smear, if this applies.   Perform a colposcopy.   Perform a laparoscopy.  HOW ARE STDs TREATED? Treatment depends on the STD. Some STDs may be treated but not cured.   Chlamydia, gonorrhea, trichomonas, and syphilis can be cured with antibiotics.   Genital herpes, hepatitis, and HIV can be treated, but not cured, with prescribed medicines. The medicines lessen symptoms.   Genital warts from HPV can be treated with medicine or by freezing, burning (electrocautery), or surgery. Warts may come back.   HPV cannot be cured with medicine or surgery. However, abnormal areas may be removed from the cervix, vagina, or vulva.   If your diagnosis is confirmed, your recent sexual partners need treatment. This is true even if they are symptom-free or have a negative culture or evaluation. They should not have sex until their health care providers say it is OK. HOW CAN I REDUCE MY RISK OF GETTING AN STD?  Use latex condoms, dental dams, and water-soluble lubricants during sexual activity. Do not use petroleum jelly or oils.  Get vaccinated for HPV and hepatitis. If you have not received these vaccines in the past, talk to your health care provider about whether one or both might be right for you.   Avoid risky sex practices that can break the skin.  WHAT SHOULD I DO IF I THINK I HAVE AN STD?  See your health care provider.   Inform all sexual partners. They should be tested and treated for any STDs.  Do not have sex until your health care provider says it is OK. WHEN SHOULD I GET HELP? Seek immediate medical care if:  You develop severe abdominal pain.  You are a man and notice swelling or pain in the testicles.  You are a woman and notice  swelling or pain in your vagina. Document Released: 11/03/2002 Document Revised: 06/03/2013 Document Reviewed: 03/03/2013 [facility]Exit[facility] Patient Information 2014 [place]Exit[employer], [place]LL[place].

## 2013-11-13 LAB — URINE CYTOLOGY ANCILLARY ONLY
Chlamydia: NEGATIVE
Neisseria Gonorrhea: NEGATIVE
TRICH (WINDOWPATH): NEGATIVE

## 2017-01-24 ENCOUNTER — Ambulatory Visit (HOSPITAL_COMMUNITY): Payer: Self-pay | Admitting: PHYSICIAN ASSISTANT

## 2019-07-08 ENCOUNTER — Ambulatory Visit (HOSPITAL_BASED_OUTPATIENT_CLINIC_OR_DEPARTMENT_OTHER): Payer: Self-pay

## 2019-07-08 DIAGNOSIS — Z20828 Contact with and (suspected) exposure to other viral communicable diseases: Secondary | ICD-10-CM

## 2019-07-08 NOTE — Telephone Encounter (Signed)
Spoke with pt. via phone. 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 No sx or exposure that he is aware of. Ordering provider is Dr. Janey Greaser.     Update: Upon calling patient to inform that [facility] COVID-19 testing had been approved, patient informed this Probation officer that he is a [place] and not a Emergency planning/management officer Medicine. Made aware that that testing is billed. Patient reports that he would like to seek out other free testing sites. States that there a testing site that is local to him in [place].     Veneda Melter, RN  07/08/2019, 13:56
